# Patient Record
Sex: Male | Born: 1980 | Race: Black or African American | Hispanic: No | Marital: Single | Smoking: Never smoker
Health system: Southern US, Community
[De-identification: ages and names within clinical notes are randomized; demographics above are authoritative.]

## PROBLEM LIST (undated history)

## (undated) HISTORY — PX: TONSILLECTOMY: SUR1361

---

## 2012-10-11 ENCOUNTER — Encounter (HOSPITAL_COMMUNITY): Payer: Self-pay | Admitting: *Deleted

## 2012-10-11 ENCOUNTER — Emergency Department (HOSPITAL_COMMUNITY)
Admission: EM | Admit: 2012-10-11 | Discharge: 2012-10-12 | Disposition: A | Discharge status: Home / Self Care | Payer: BC Managed Care – PPO | Attending: Emergency Medicine | Admitting: Emergency Medicine

## 2012-10-11 DIAGNOSIS — R51 Headache: Secondary | ICD-10-CM | POA: Insufficient documentation

## 2012-10-11 DIAGNOSIS — J3489 Other specified disorders of nose and nasal sinuses: Secondary | ICD-10-CM | POA: Insufficient documentation

## 2012-10-11 DIAGNOSIS — J029 Acute pharyngitis, unspecified: Secondary | ICD-10-CM | POA: Insufficient documentation

## 2012-10-11 DIAGNOSIS — R358 Other polyuria: Secondary | ICD-10-CM | POA: Insufficient documentation

## 2012-10-11 DIAGNOSIS — R109 Unspecified abdominal pain: Secondary | ICD-10-CM | POA: Insufficient documentation

## 2012-10-11 DIAGNOSIS — R5383 Other fatigue: Secondary | ICD-10-CM | POA: Insufficient documentation

## 2012-10-11 DIAGNOSIS — R110 Nausea: Secondary | ICD-10-CM | POA: Insufficient documentation

## 2012-10-11 DIAGNOSIS — R197 Diarrhea, unspecified: Secondary | ICD-10-CM | POA: Insufficient documentation

## 2012-10-11 DIAGNOSIS — M791 Myalgia, unspecified site: Secondary | ICD-10-CM

## 2012-10-11 DIAGNOSIS — R5381 Other malaise: Secondary | ICD-10-CM | POA: Insufficient documentation

## 2012-10-11 DIAGNOSIS — IMO0001 Reserved for inherently not codable concepts without codable children: Secondary | ICD-10-CM | POA: Insufficient documentation

## 2012-10-11 DIAGNOSIS — R509 Fever, unspecified: Secondary | ICD-10-CM | POA: Insufficient documentation

## 2012-10-11 DIAGNOSIS — R3589 Other polyuria: Secondary | ICD-10-CM | POA: Insufficient documentation

## 2012-10-11 MED ORDER — KETOROLAC TROMETHAMINE 30 MG/ML IJ SOLN
INTRAMUSCULAR | Status: AC
  Administered 2012-10-11: 15 mg
  Filled 2012-10-11: qty 1

## 2012-10-11 MED ORDER — KETOROLAC TROMETHAMINE 15 MG/ML IJ SOLN
15.0000 mg | Freq: Once | INTRAMUSCULAR | Status: DC
  Filled 2012-10-11: qty 1

## 2012-10-11 MED ORDER — DICYCLOMINE HCL 10 MG PO CAPS
10.0000 mg | ORAL_CAPSULE | Freq: Once | ORAL | Status: AC
  Administered 2012-10-11: 10 mg via ORAL
  Filled 2012-10-11: qty 1

## 2012-10-11 MED ORDER — ACETAMINOPHEN 500 MG PO TABS
1000.0000 mg | ORAL_TABLET | Freq: Once | ORAL | Status: AC
  Administered 2012-10-11: 1000 mg via ORAL
  Filled 2012-10-11: qty 2

## 2012-10-11 MED ORDER — LACTATED RINGERS IV BOLUS (SEPSIS)
1000.0000 mL | Freq: Once | INTRAVENOUS | Status: AC
  Administered 2012-10-11: 1000 mL via INTRAVENOUS

## 2012-10-11 MED ORDER — ONDANSETRON HCL 4 MG/2ML IJ SOLN
4.0000 mg | Freq: Once | INTRAMUSCULAR | Status: AC
  Administered 2012-10-11: 4 mg via INTRAVENOUS
  Filled 2012-10-11: qty 2

## 2012-10-11 NOTE — ED Provider Notes (Signed)
History   This chart was scribed for Cameron Skene, MD, by Frederik Pear, ER scribe. The patient was seen in room APA07/APA07 and the patient's care was started at 2311.    CSN: [id number]  Arrival date & time 10/11/12  2103   First MD Initiated Contact with Patient 10/11/12 2311      Chief Complaint  Patient presents with  . Fever  . Weakness  . Abdominal Pain    (Consider location/radiation/quality/duration/timing/severity/associated sxs/prior treatment) HPI Cameron Mason is a 31 y.o. male who presents to the Emergency Department complaining of a constant, gradually worsening fever, with associated sore throat, nausea, diarrhea 2x, crampy abdominal pain that comes and goes, congestion, headache, mild polyuria, and muscle aches that began today when he awoke.,  In ED, his temperature is 101.2. He states that he took some ibuprofen this morning without relief. He denies chest pain, dysuria, back pain, SOB, hematochezia, or a rash. He states that his last BM was 11 hours ago and was normal. His mother reports that his grandfather and nephew have both recently experienced similar symptoms.He denies having a current flu shot.  History reviewed. No pertinent past medical history.  Past Surgical History  Procedure Date  . Tonsillectomy     History reviewed. No pertinent family history.  History  Substance Use Topics  . Smoking status: Never Smoker   . Smokeless tobacco: Not on file  . Alcohol Use: No      Review of Systems At least 10pt or greater review of systems completed and are negative except where specified in the HPI. Allergies  Review of patient's allergies indicates no known allergies.  Home Medications   Current Outpatient Rx  Name  Route  Sig  Dispense  Refill  . OMEGA-3 FATTY ACIDS 1000 MG PO CAPS   Oral   Take 1 g by mouth daily.         Marland Kitchen PSEUDOEPH-DOXYLAMINE-DM-APAP 60-7.03-14-999 MG/30ML PO LIQD   Oral   Take 15-30 mLs by mouth daily as needed.  FOR COLD SYMPTOMS           BP 113/74  Pulse 103  Temp 101.2 F (38.4 C) (Oral)  Resp 20  Ht 6' (1.829 m)  Wt 218 lb (98.884 kg)  BMI 29.57 kg/m2  SpO2 96%  Physical Exam  Nursing notes reviewed.  Electronic medical record reviewed. VITAL SIGNS:   Filed Vitals:   10/11/12 2109 10/11/12 2313  BP: 156/88 113/74  Pulse: 104 103  Temp: 101.2 F (38.4 C)   TempSrc: Oral   Resp: 20 20  Height: 6' (1.829 m)   Weight: 218 lb (98.884 kg)   SpO2: 100% 96%   CONSTITUTIONAL: Awake, oriented, appears non-toxic HENT: Atraumatic, normocephalic, oral mucosa pink and moist, airway patent. Nares patent with clear drainage and mild turbinate inflammation with congestion. External ears normal. EYES: Conjunctiva clear, EOMI, PERRLA NECK: Trachea midline, non-tender, supple CARDIOVASCULAR: Normal heart rate, Normal rhythm, No murmurs, rubs, gallops PULMONARY/CHEST: Clear to auscultation, no rhonchi, wheezes, or rales. Symmetrical breath sounds. Non-tender. ABDOMINAL: Non-distended, soft, non-tender - no rebound or guarding.  BS normal. NEUROLOGIC: Non-focal, moving all four extremities, no gross sensory or motor deficits. EXTREMITIES: No clubbing, cyanosis, or edema SKIN: Warm, Dry, No erythema, No rash  ED Course  Procedures (including critical care time)  DIAGNOSTIC STUDIES: Oxygen Saturation is 100% on room air, normal by my interpretation.    COORDINATION OF CARE:  23:44- Discussed planned course of treatment with the patient, including  Tylenol and Zofran, who is agreeable at this time.    Labs Reviewed  URINALYSIS, ROUTINE W REFLEX MICROSCOPIC  LAB REPORT - SCANNED   No results found. No results found.   1. Influenza-like illness   2. Myalgia   3. Abdominal pain     Medications  Pseudoeph-Doxylamine-DM-APAP (NYQUIL) 60-7.03-14-999 MG/30ML LIQD (not administered)  fish oil-omega-3 fatty acids 1000 MG capsule (not administered)  ibuprofen (ADVIL,MOTRIN) 800 MG  tablet (not administered)  lactated ringers bolus 1,000 mL (0 mL Intravenous Stopped 10/12/12 0111)  ketorolac (TORADOL) 30 MG/ML injection (15 mg  Given 10/11/12 2335)  acetaminophen (TYLENOL) tablet 1,000 mg (1000 mg Oral Given 10/11/12 2351)  ondansetron (ZOFRAN) injection 4 mg (4 mg Intravenous Given 10/11/12 2352)  dicyclomine (BENTYL) capsule 10 mg (10 mg Oral Given 10/11/12 2351)      MDM  [name] is a 31 y.o. male presents with influenza like illness.  Fluids and medicaiton given, pt feeling better.  Data for risk vs harm of oseltamivir favors harm w/GI distress - so no Rx for that given, conservative Rx for nausea, ibuprofen   I personally performed the services described in this documentation, which was scribed in my presence. The recorded information has been reviewed and is accurate.    Cameron Skene, MD 10/14/12 [phone]

## 2012-10-11 NOTE — ED Notes (Signed)
Pt c/o fever, stomach ache, and just feels "weak."

## 2012-10-12 LAB — URINALYSIS, ROUTINE W REFLEX MICROSCOPIC
Bilirubin Urine: NEGATIVE
Ketones, ur: NEGATIVE mg/dL
Leukocytes, UA: NEGATIVE
Nitrite: NEGATIVE
Protein, ur: NEGATIVE mg/dL

## 2012-10-12 MED ORDER — IBUPROFEN 800 MG PO TABS: 800.0000 mg | ORAL_TABLET | Freq: Three times a day (TID) | ORAL | Status: AC

## 2015-11-13 ENCOUNTER — Encounter (HOSPITAL_COMMUNITY): Payer: Self-pay

## 2015-11-13 ENCOUNTER — Emergency Department (HOSPITAL_COMMUNITY)
Admission: EM | Admit: 2015-11-13 | Discharge: 2015-11-14 | Disposition: A | Discharge status: Home / Self Care | Payer: BLUE CROSS/BLUE SHIELD | Attending: Emergency Medicine | Admitting: Emergency Medicine

## 2015-11-13 ENCOUNTER — Emergency Department (HOSPITAL_COMMUNITY): Payer: BLUE CROSS/BLUE SHIELD

## 2015-11-13 DIAGNOSIS — R05 Cough: Secondary | ICD-10-CM | POA: Diagnosis present

## 2015-11-13 DIAGNOSIS — Z791 Long term (current) use of non-steroidal anti-inflammatories (NSAID): Secondary | ICD-10-CM | POA: Insufficient documentation

## 2015-11-13 DIAGNOSIS — R059 Cough, unspecified: Secondary | ICD-10-CM

## 2015-11-13 DIAGNOSIS — J069 Acute upper respiratory infection, unspecified: Secondary | ICD-10-CM

## 2015-11-13 MED ORDER — BENZONATATE 100 MG PO CAPS
200.0000 mg | ORAL_CAPSULE | Freq: Once | ORAL | Status: AC
  Administered 2015-11-14: 200 mg via ORAL
  Filled 2015-11-13: qty 2

## 2015-11-13 NOTE — ED Notes (Signed)
Cough x 1 week per pt.  Denies fever, SOB.

## 2015-11-14 MED ORDER — HYDROCOD POLST-CPM POLST ER 10-8 MG/5ML PO SUER
5.0000 mL | Freq: Once | ORAL | Status: AC
  Administered 2015-11-14: 5 mL via ORAL
  Filled 2015-11-14: qty 5

## 2015-11-14 MED ORDER — HYDROCOD POLST-CPM POLST ER 10-8 MG/5ML PO SUER: 5.0000 mL | Freq: Two times a day (BID) | ORAL | Status: AC | PRN

## 2015-11-14 MED ORDER — BENZONATATE 100 MG PO CAPS: 200.0000 mg | ORAL_CAPSULE | Freq: Three times a day (TID) | ORAL | Status: AC | PRN

## 2015-11-14 NOTE — ED Provider Notes (Signed)
CSN: [id number]     Arrival date & time 11/13/15  2225 History   First MD Initiated Contact with Patient 11/13/15 2247     Chief Complaint  Patient presents with  . Cough     (Consider location/radiation/quality/duration/timing/severity/associated sxs/prior Treatment) The history is provided by the patient.   Cameron Mason is a 35 y.o. male presenting with a persistent dry cough.  He reports having cold like symptoms times one week which included the cough plus nasal congestion and rhinorrhea, mild sore throat which is now resolved but has been unable to shake the cough despite trying robitussin, theraflu, nyquil, honey and cough lozenges.  His cough keeps him awake at night.  He denies chest pain, shortness of breath, nausea, post nasal drip and wheezing.    History reviewed. No pertinent past medical history. Past Surgical History  Procedure Laterality Date  . Tonsillectomy     No family history on file. Social History  Substance Use Topics  . Smoking status: Never Smoker   . Smokeless tobacco: None  . Alcohol Use: No    Review of Systems  Constitutional: Negative for fever and chills.  HENT: Positive for congestion, rhinorrhea and sore throat. Negative for ear pain, postnasal drip, sinus pressure, trouble swallowing and voice change.   Eyes: Negative for discharge.  Respiratory: Positive for cough. Negative for shortness of breath, wheezing and stridor.   Cardiovascular: Negative for chest pain.  Gastrointestinal: Negative for abdominal pain.  Genitourinary: Negative.       Allergies  Review of patient's allergies indicates no known allergies.  Home Medications   Prior to Admission medications   Medication Sig Start Date End Date Taking? Authorizing Provider  dextromethorphan (ROBITUSSIN 12 HOUR COUGH) 30 MG/5ML liquid Take 30 mg by mouth as needed for cough.   Yes Historical Provider, MD  Phenylephrine-Pheniramine-DM [facility] COLD & COUGH) 08-05-19 MG PACK  Take 1 packet by mouth every 4 (four) hours as needed (Cold Symptoms).   Yes Historical Provider, MD  Pseudoeph-Doxylamine-DM-APAP (NYQUIL) 60-7.03-14-999 MG/30ML LIQD Take 30 mLs by mouth daily as needed. FOR COLD SYMPTOMS   Yes Historical Provider, MD  ibuprofen (ADVIL,MOTRIN) 800 MG tablet Take 1 tablet (800 mg total) by mouth 3 (three) times daily. 10/12/12   John-Adam Bonk, MD   BP 138/83 mmHg  Pulse 79  Temp(Src) 97.8 F (36.6 C) (Oral)  Resp 18  Ht  (1.854 m)  Wt 100.109 kg  BMI 29.12 kg/m2  SpO2 99% Physical Exam  Constitutional: He appears well-developed and well-nourished.  HENT:  Head: Normocephalic and atraumatic.  Eyes: Conjunctivae are normal.  Neck: Normal range of motion.  Cardiovascular: Normal rate, regular rhythm, normal heart sounds and intact distal pulses.   Pulmonary/Chest: Effort normal and breath sounds normal. No stridor. No respiratory distress. He has no wheezes. He has no rales. He exhibits no tenderness.  Frequent dry cough.  Abdominal: Soft. Bowel sounds are normal. There is no tenderness.  Musculoskeletal: Normal range of motion.  Lymphadenopathy:    He has no cervical adenopathy.  Neurological: He is alert.  Skin: Skin is warm and dry.  Psychiatric: He has a normal mood and affect.  Nursing note and vitals reviewed.   ED Course  Procedures (including critical care time) Labs Review Labs Reviewed - No data to display  Imaging Review Dg Chest 2 View  11/13/2015  CLINICAL DATA:  35 year old presenting with 1 week history of cough. EXAM: CHEST  2 VIEW COMPARISON:  None. FINDINGS: Suboptimal  inspiration accounts for crowded bronchovascular markings, especially in the bases, and accentuates the cardiac silhouette. Taking this into account, cardiomediastinal silhouette unremarkable. Lungs clear. Bronchovascular markings normal. Pulmonary vascularity normal. No visible pleural effusions. No pneumothorax. Visualized bony thorax intact. IMPRESSION:  Suboptimal inspiration.  No acute cardiopulmonary disease. Electronically Signed   By: Hulan Saas M.D.   On: 11/13/2015 23:58   I have personally reviewed and evaluated these images and lab results as part of my medical decision-making.   EKG Interpretation None      MDM   Final diagnoses:  Acute URI  Cough    Prescribed tessalon, tussionex. Advised increased fluid intake, continued cough lozenges, prn f/u with pcp if sx persist.   The patient appears reasonably screened and/or stabilized for discharge and I doubt any other medical condition or other [facility] requiring further screening, evaluation, or treatment in the ED at this time prior to discharge.     Burgess Amor, PA-C 11/14/15 1610  Gilda Crease, MD 11/14/15 [phone]

## 2015-11-14 NOTE — Discharge Instructions (Signed)
Cough, Adult Coughing is a reflex that clears your throat and your airways. Coughing helps to heal and protect your lungs. It is normal to cough occasionally, but a cough that happens with other symptoms or lasts a long time may be a sign of a condition that needs treatment. A cough may last only 2-3 weeks (acute), or it may last longer than 8 weeks (chronic). CAUSES Coughing is commonly caused by:  Breathing in substances that irritate your lungs.  A viral or bacterial respiratory infection.  Allergies.  Asthma.  Postnasal drip.  Smoking.  Acid backing up from the stomach into the esophagus (gastroesophageal reflux).  Certain medicines.  Chronic lung problems, including COPD (or rarely, lung cancer).  Other medical conditions such as heart failure. HOME CARE INSTRUCTIONS  Pay attention to any changes in your symptoms. Take these actions to help with your discomfort:  Take medicines only as told by your health care provider.  If you were prescribed an antibiotic medicine, take it as told by your health care provider. Do not stop taking the antibiotic even if you start to feel better.  Talk with your health care provider before you take a cough suppressant medicine.  Drink enough fluid to keep your urine clear or pale yellow.  If the air is dry, use a cold steam vaporizer or humidifier in your bedroom or your home to help loosen secretions.  Avoid anything that causes you to cough at work or at home.  If your cough is worse at night, try sleeping in a semi-upright position.  Avoid cigarette smoke. If you smoke, quit smoking. If you need help quitting, ask your health care provider.  Avoid caffeine.  Avoid alcohol.  Rest as needed. SEEK MEDICAL CARE IF:   You have new symptoms.  You cough up pus.  Your cough does not get better after 2-3 weeks, or your cough gets worse.  You cannot control your cough with suppressant medicines and you are losing sleep.  You  develop pain that is getting worse or pain that is not controlled with pain medicines.  You have a fever.  You have unexplained weight loss.  You have night sweats. SEEK IMMEDIATE MEDICAL CARE IF:  You cough up blood.  You have difficulty breathing.  Your heartbeat is very fast.   This information is not intended to replace advice given to you by your health care provider. Make sure you discuss any questions you have with your health care provider.   Document Released: 03/31/2011 Document Revised: 06/23/2015 Document Reviewed: 12/09/2014 Elsevier Interactive Patient Education 2016 [employer].    You may take the tussionex  prescribed for cough relief.  This will make you drowsy - do not drive within 4 hours of taking this medication.

## 2016-12-27 IMAGING — DX DG CHEST 2V
2 series · 2 of 2 positions shown · non-contrast
Comparison: None.

CLINICAL DATA: 34-year-old presenting with 1 week history of cough.

EXAM:
CHEST  2 VIEW

[chest pa]
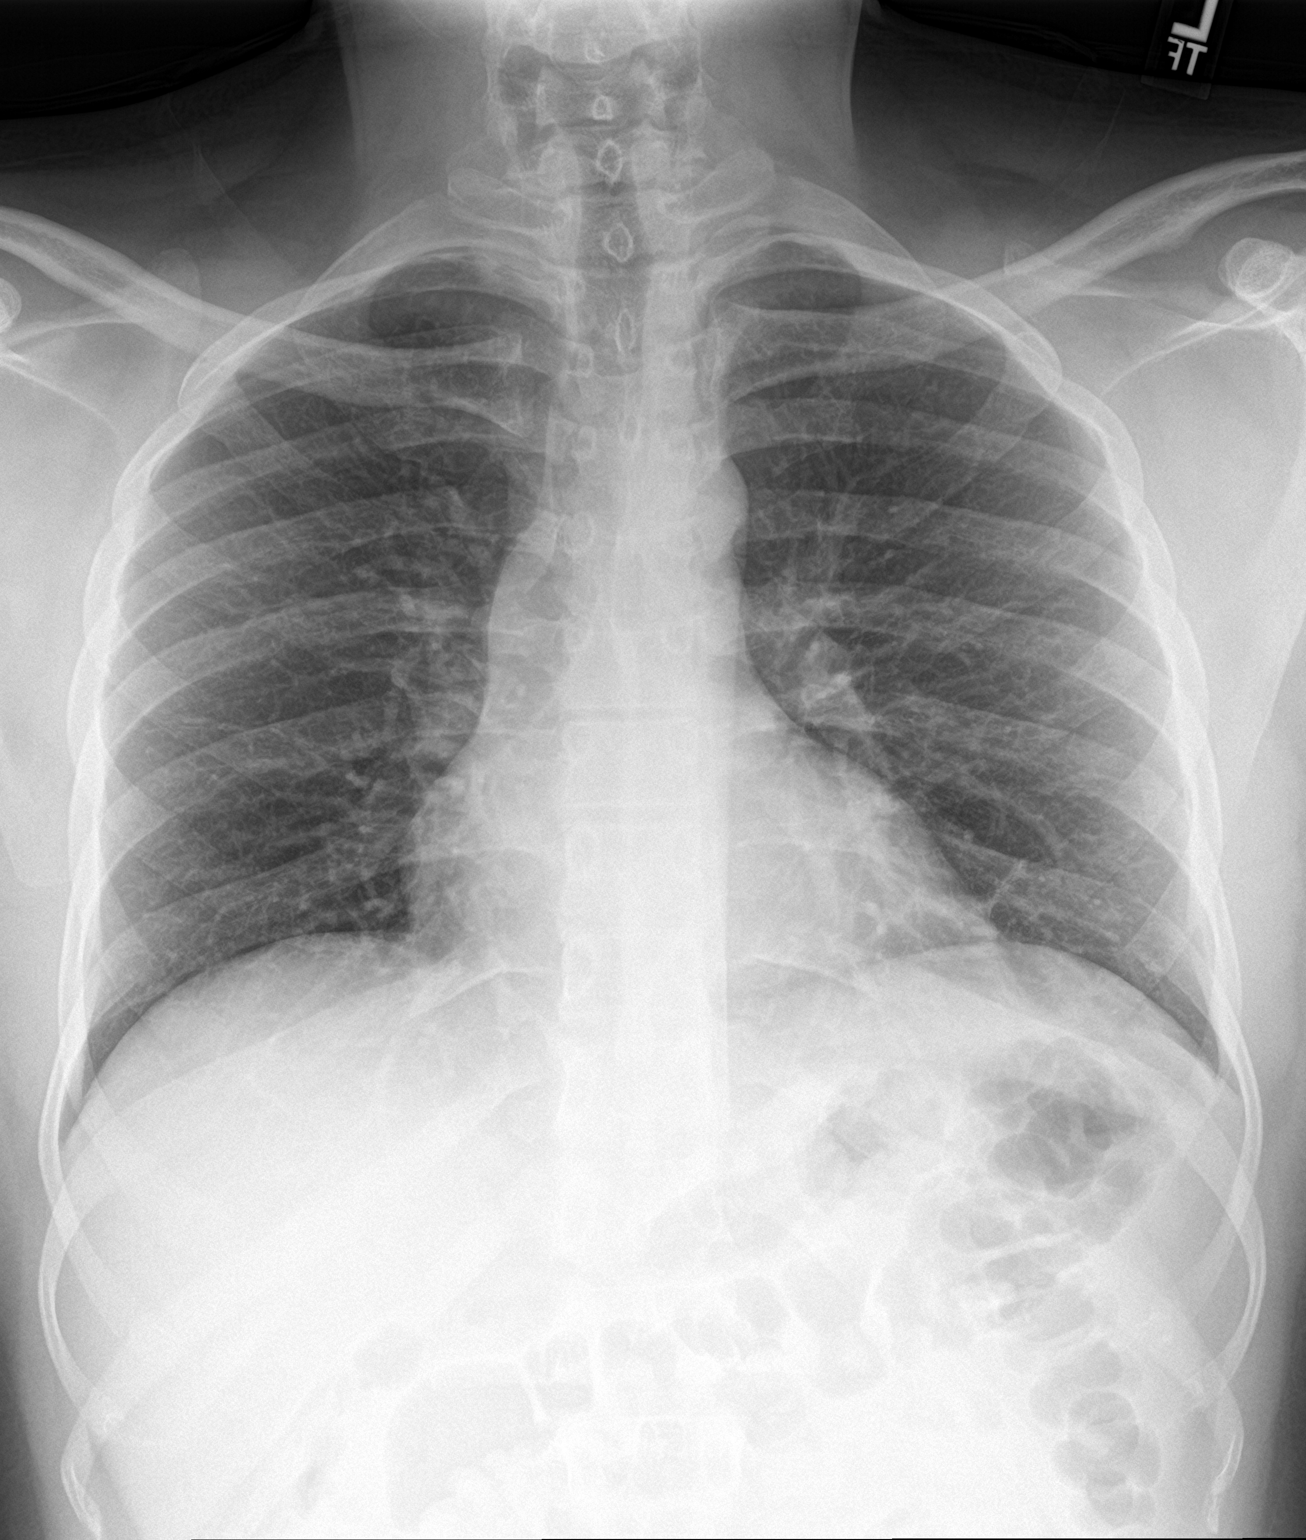

[chest lat]
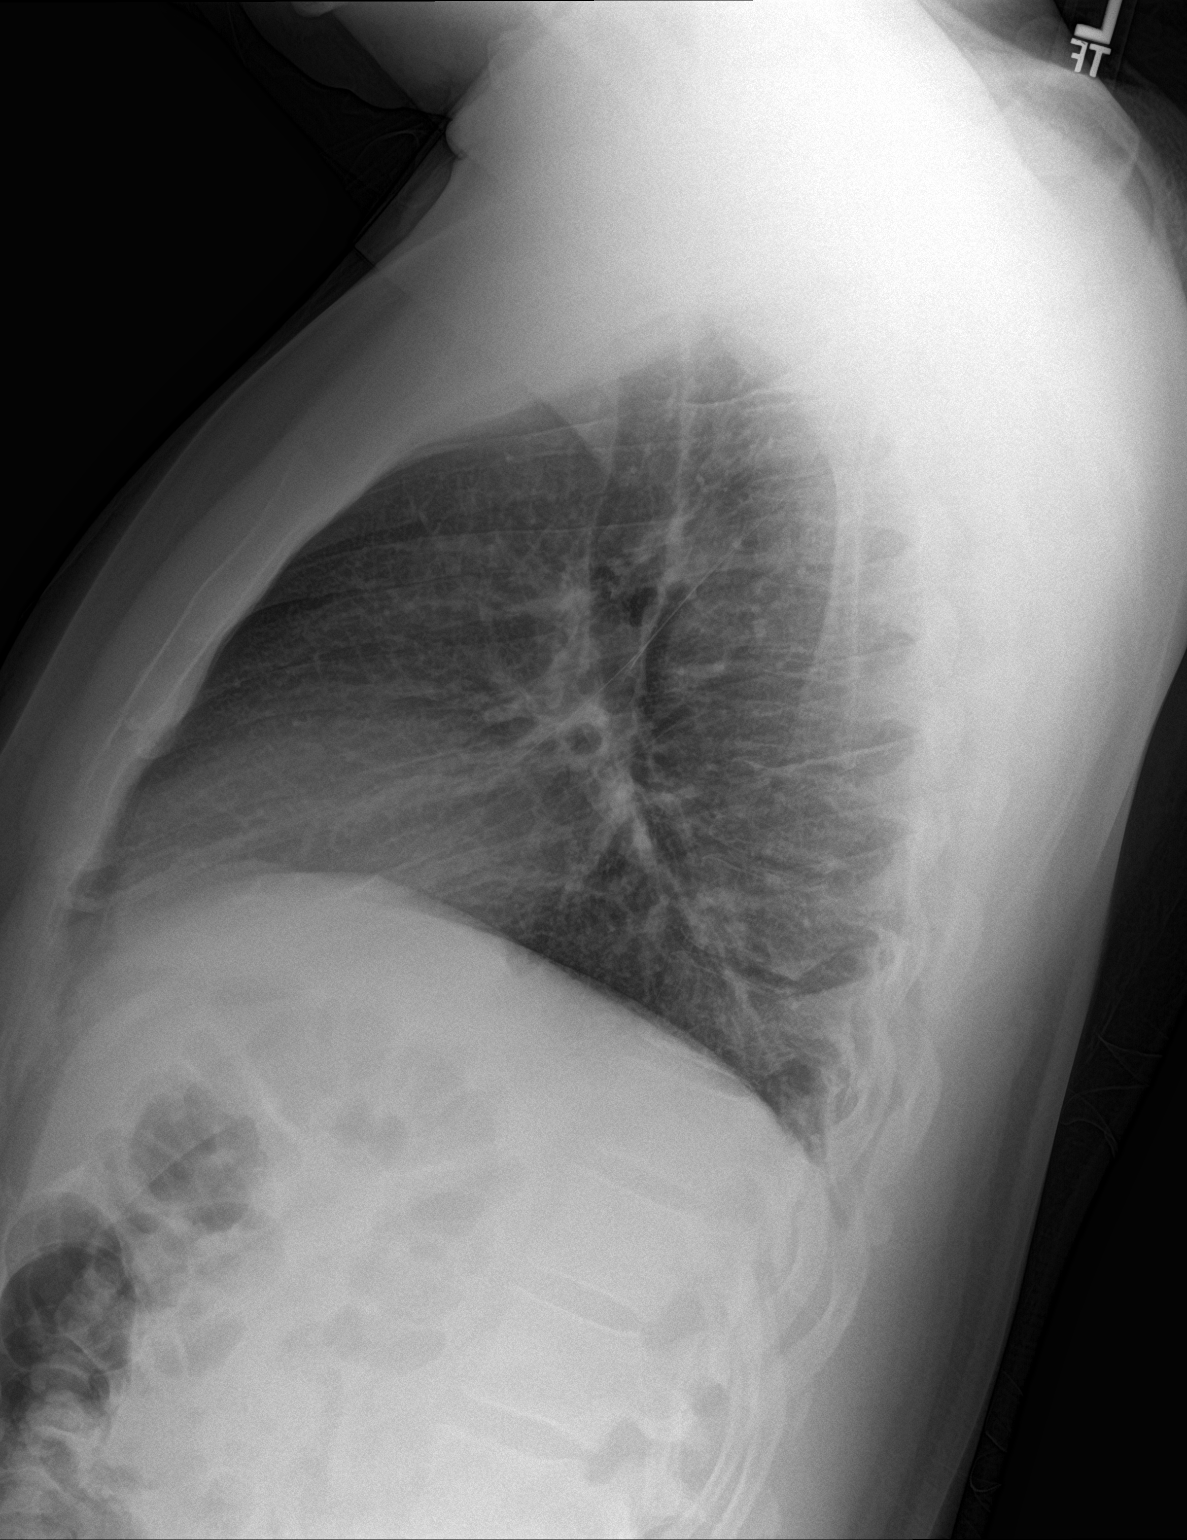

[2 of 2 positions shown; findings below may reference images not displayed]

FINDINGS: Suboptimal inspiration accounts for crowded bronchovascular
markings, especially in the bases, and accentuates the cardiac
silhouette. Taking this into account, cardiomediastinal silhouette
unremarkable. Lungs clear. Bronchovascular markings normal.
Pulmonary vascularity normal. No visible pleural effusions. No
pneumothorax. Visualized bony thorax intact.
IMPRESSION: Suboptimal inspiration.  No acute cardiopulmonary disease.
